# Patient Record
Sex: Female | Born: 2000 | Race: White | Hispanic: No | Smoking: Former smoker
Health system: Southern US, Community
[De-identification: ages and names within clinical notes are randomized; demographics above are authoritative.]

## PROBLEM LIST (undated history)

## (undated) ENCOUNTER — Ambulatory Visit (HOSPITAL_COMMUNITY): Admission: EM | Payer: 59 | Source: Home / Self Care

## (undated) DIAGNOSIS — L709 Acne, unspecified: Secondary | ICD-10-CM

## (undated) DIAGNOSIS — I10 Essential (primary) hypertension: Secondary | ICD-10-CM

## (undated) HISTORY — DX: Essential (primary) hypertension: I10

## (undated) HISTORY — DX: Acne, unspecified: L70.9

## (undated) HISTORY — PX: WISDOM TOOTH EXTRACTION: SHX21

---
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
## 2020-02-23 ENCOUNTER — Encounter: Payer: 59 | Admitting: Family Medicine

## 2020-05-06 ENCOUNTER — Encounter: Payer: 59 | Admitting: Family Medicine

## 2020-05-09 ENCOUNTER — Encounter: Payer: 59 | Admitting: Family Medicine

## 2020-05-30 ENCOUNTER — Other Ambulatory Visit: Payer: Self-pay

## 2020-05-30 ENCOUNTER — Encounter: Payer: Self-pay | Admitting: Family Medicine

## 2020-05-30 ENCOUNTER — Ambulatory Visit (INDEPENDENT_AMBULATORY_CARE_PROVIDER_SITE_OTHER): Payer: 59 | Admitting: Family Medicine

## 2020-05-30 VITALS — BP 128/76 | HR 88 | Temp 98.0°F | Resp 14 | Ht 63.5 in | Wt 194.0 lb

## 2020-05-30 DIAGNOSIS — Z309 Encounter for contraceptive management, unspecified: Secondary | ICD-10-CM

## 2020-05-30 DIAGNOSIS — Z0000 Encounter for general adult medical examination without abnormal findings: Secondary | ICD-10-CM

## 2020-05-30 DIAGNOSIS — E669 Obesity, unspecified: Secondary | ICD-10-CM

## 2020-05-30 DIAGNOSIS — Z23 Encounter for immunization: Secondary | ICD-10-CM

## 2020-05-30 DIAGNOSIS — Z0001 Encounter for general adult medical examination with abnormal findings: Secondary | ICD-10-CM

## 2020-05-30 NOTE — Addendum Note (Signed)
Addended by: Sheral Flow on: 05/30/2020 06:05 PM   Modules accepted: Orders

## 2020-05-30 NOTE — Progress Notes (Signed)
Subjective:    Patient ID: Faith Bowers, female    DOB: May 15, 2000, 20 y.o.   MRN: [id number]  HPI  Patient is a very pleasant 20 year old Caucasian female here today for complete physical exam.  She has not yet 21 so she does not require Pap smear.  However her Nexplanon is about to expire and she would like to see a gynecologist for her pelvic exam as well as future Pap smears.  Otherwise she is doing well.  She does have a history of obesity, mild hyperglycemia, family history of diabetes, and she also smokes. Past Medical History:  Diagnosis Date  . Acne    Past Surgical History:  Procedure Laterality Date  . TONSILLECTOMY AND ADENOIDECTOMY  2007  . WISDOM TOOTH EXTRACTION     Current Outpatient Medications on File Prior to Visit  Medication Sig Dispense Refill  . Etonogestrel (NEXPLANON [place]) Inject into the skin.    Marland Kitchen guaiFENesin (MUCINEX) 600 MG 12 hr tablet Take by mouth 2 (two) times daily.     No current facility-administered medications on file prior to visit.   No Known Allergies Social History   Socioeconomic History  . Marital status: Single    Spouse name: Not on file  . Number of children: Not on file  . Years of education: Not on file  . Highest education level: Not on file  Occupational History  . Not on file  Tobacco Use  . Smoking status: Current Every Day Smoker    Types: Cigarettes  . Smokeless tobacco: Never Used  Vaping Use  . Vaping Use: Former  Substance and Sexual Activity  . Alcohol use: Never    Alcohol/week: 0.0 standard drinks  . Drug use: Never  . Sexual activity: Yes    Birth control/protection: Implant  Other Topics Concern  . Not on file  Social History Narrative  . Not on file   Social Determinants of Health   Financial Resource Strain: Not on file  Food Insecurity: Not on file  Transportation Needs: Not on file  Physical Activity: Not on file  Stress: Not on file  Social Connections: Not on file  Intimate Partner  Violence: Not on file   Family History  Problem Relation Age of Onset  . Anxiety disorder Mother   . Asthma Mother   . Diabetes Mother   . Hypertension Mother   . Hyperlipidemia Mother   . Asthma Brother   . Diabetes Brother   . Hypertension Brother   . Arthritis Maternal Grandmother   . Diabetes Maternal Grandmother   . Arthritis Maternal Grandfather   . COPD Maternal Grandfather   . Diabetes Maternal Grandfather      Review of Systems  All other systems reviewed and are negative.      Objective:   Physical Exam Vitals reviewed.  Constitutional:      General: She is not in acute distress.    Appearance: Normal appearance. She is obese. She is not ill-appearing, toxic-appearing or diaphoretic.  HENT:     Head: Normocephalic and atraumatic.     Right Ear: Tympanic membrane and ear canal normal. There is no impacted cerumen.     Left Ear: Tympanic membrane and ear canal normal. There is no impacted cerumen.     Nose: Nose normal. No congestion or rhinorrhea.     Mouth/Throat:     Mouth: Mucous membranes are moist.     Pharynx: No oropharyngeal exudate or posterior oropharyngeal erythema.  Eyes:  General:        Right eye: No discharge.        Left eye: No discharge.     Extraocular Movements: Extraocular movements intact.     Conjunctiva/sclera: Conjunctivae normal.     Pupils: Pupils are equal, round, and reactive to light.  Neck:     Vascular: No carotid bruit.  Cardiovascular:     Rate and Rhythm: Normal rate and regular rhythm.     Pulses: Normal pulses.     Heart sounds: Normal heart sounds. No murmur heard. No friction rub. No gallop.   Pulmonary:     Effort: Pulmonary effort is normal. No respiratory distress.     Breath sounds: Normal breath sounds. No stridor. No wheezing, rhonchi or rales.  Chest:     Chest wall: No tenderness.  Abdominal:     General: Abdomen is flat. Bowel sounds are normal. There is no distension.     Palpations: Abdomen is  soft. There is no mass.     Tenderness: There is no abdominal tenderness. There is no guarding or rebound.     Hernia: No hernia is present.  Musculoskeletal:     Cervical back: Normal range of motion and neck supple. No rigidity or tenderness.     Right lower leg: No edema.     Left lower leg: No edema.  Lymphadenopathy:     Cervical: No cervical adenopathy.  Skin:    General: Skin is warm.     Coloration: Skin is not jaundiced or pale.     Findings: No bruising, erythema, lesion or rash.  Neurological:     General: No focal deficit present.     Mental Status: She is alert and oriented to person, place, and time. Mental status is at baseline.     Cranial Nerves: No cranial nerve deficit.     Sensory: No sensory deficit.     Motor: No weakness.     Coordination: Coordination normal.     Gait: Gait normal.     Deep Tendon Reflexes: Reflexes normal.  Psychiatric:        Mood and Affect: Mood normal.        Behavior: Behavior normal.        Thought Content: Thought content normal.        Judgment: Judgment normal.           Assessment & Plan:  Obesity (BMI 30-39.9)  General medical exam  Encounter for contraceptive management, unspecified type  Patient would like to establish care with a gynecologist.  She is not yet due for a Pap smear however she prefers to see a female provider for a pelvic exam because she is having some pelvic cramping.  She is currently using a Nexplanon for birth control and this is about to expire.  Therefore she would like to establish care with a gynecologist to discuss possibly an IUD.  I will be glad to arrange this.  She received her meningitis vaccine today.  I would like her to return fasting for a CBC, CMP, lipid panel, and an A1c.

## 2020-06-20 ENCOUNTER — Encounter: Payer: Self-pay | Admitting: Obstetrics & Gynecology

## 2020-07-12 ENCOUNTER — Encounter: Payer: Self-pay | Admitting: Obstetrics & Gynecology

## 2020-08-13 ENCOUNTER — Other Ambulatory Visit: Payer: Self-pay

## 2020-08-13 ENCOUNTER — Ambulatory Visit (INDEPENDENT_AMBULATORY_CARE_PROVIDER_SITE_OTHER): Payer: 59 | Admitting: Women's Health

## 2020-08-13 ENCOUNTER — Other Ambulatory Visit: Payer: Self-pay | Admitting: Women's Health

## 2020-08-13 ENCOUNTER — Encounter: Payer: Self-pay | Admitting: Women's Health

## 2020-08-13 VITALS — BP 148/83 | HR 106 | Ht 63.0 in | Wt 192.6 lb

## 2020-08-13 DIAGNOSIS — Z113 Encounter for screening for infections with a predominantly sexual mode of transmission: Secondary | ICD-10-CM | POA: Diagnosis not present

## 2020-08-13 DIAGNOSIS — R1010 Upper abdominal pain, unspecified: Secondary | ICD-10-CM

## 2020-08-13 DIAGNOSIS — Z3009 Encounter for other general counseling and advice on contraception: Secondary | ICD-10-CM

## 2020-08-13 NOTE — Progress Notes (Signed)
   GYN VISIT Patient name: Faith Bowers MRN [id number]  Date of birth: [birth year] Chief Complaint:   Contraception  History of Present Illness:   Faith Bowers is a 20 y.o. G0P0000 Caucasian female being seen today as new pt to discuss birth control. Currently has Nexplanon inserted Jan 2020 in [place]. Has been having upper abdominal pain and wonders if it's from nexplanon. Normal bm's per pt. Denies lower abd/pelvic pain. Periods irregular since Nexplanon insertion-  were regular prior to insertion-discussed this is normal. Denies abnormal discharge, itching/odor/irritation.  Discussed all options of contraception- thinks she wants to stay w/ Nexplanon and get reinserted in Jan when due.  Patient's last menstrual period was 05/23/2020 (approximate). The current method of family planning is Nexplanon.  Last pap <21yo.   Depression screen [facility] 2/9 08/13/2020 05/30/2020 10/04/2017 08/03/2017  Decreased Interest 0 0 0 0  Down, Depressed, Hopeless 0 0 0 0  PHQ - 2 Score 0 0 0 0  Altered sleeping 0 0 0 0  Tired, decreased energy 0 0 2 2  Change in appetite 0 0 0 0  Feeling bad or failure about yourself  0 0 0 0  Trouble concentrating 0 0 0 0  Moving slowly or fidgety/restless 0 0 0 0  Suicidal thoughts 0 0 0 0  PHQ-9 Score 0 0 2 2  Difficult doing work/chores - Not difficult at all Not difficult at all -     GAD 7 : Generalized Anxiety Score 08/13/2020  Nervous, Anxious, on Edge 0  Control/stop worrying 0  Worry too much - different things 0  Trouble relaxing 0  Restless 0  Easily annoyed or irritable 0  Afraid - awful might happen 0  Total GAD 7 Score 0     Review of Systems:   Pertinent items are noted in HPI Denies fever/chills, dizziness, headaches, visual disturbances, fatigue, shortness of breath, chest pain, abdominal pain, vomiting, abnormal vaginal discharge/itching/odor/irritation, problems with periods, bowel movements, urination, or intercourse unless otherwise stated  above.  Pertinent History Reviewed:  Reviewed past medical,surgical, social, obstetrical and family history.  Reviewed problem list, medications and allergies. Physical Assessment:   Vitals:   08/13/20 1058  BP: (!) 148/83  Pulse: (!) 106  Weight: 192 lb 9.6 oz (87.4 kg)  Height: 5\' 3"  (1.6 m)  Body mass index is 34.12 kg/m.       Physical Examination:   General appearance: alert, well appearing, and in no distress  Mental status: alert, oriented to person, place, and time  Skin: warm & dry   Cardiovascular: normal heart rate noted  Respiratory: normal respiratory effort, no distress  Abdomen: soft, non-tender   Pelvic: examination not indicated  Extremities: no edema   Chaperone: N/A    No results found for this or any previous visit (from the past 24 hour(s)).  Assessment & Plan:  1) Contraception counseling> wants to stay w/ Nexplanon, schedule removal/reinsertion for Jan 2023  2) Upper abdominal pain> make appt w/ PCP, if no reason found for pain and wants to remove nexplanon to see if it has anything to do with it- call to make appt  3) Elevated bp> f/u w/ PCP  4) STD screen> gc/ct  Meds: No orders of the defined types were placed in this encounter.   Orders Placed This Encounter  Procedures   GC/Chlamydia Probe Amp    Return for Jan for nexplanon removal/reinsertion.  [place], [facility] 08/13/2020 11:25 AM

## 2020-08-15 LAB — GC/CHLAMYDIA PROBE AMP
Chlamydia trachomatis, NAA: NEGATIVE
Neisseria Gonorrhoeae by PCR: NEGATIVE

## 2021-02-25 ENCOUNTER — Encounter: Payer: Self-pay | Admitting: Women's Health

## 2021-02-25 ENCOUNTER — Other Ambulatory Visit: Payer: Self-pay

## 2021-02-25 ENCOUNTER — Ambulatory Visit: Payer: 59 | Admitting: Women's Health

## 2021-02-25 VITALS — BP 138/90 | HR 92 | Ht 64.0 in | Wt 192.6 lb

## 2021-02-25 DIAGNOSIS — Z30017 Encounter for initial prescription of implantable subdermal contraceptive: Secondary | ICD-10-CM | POA: Insufficient documentation

## 2021-02-25 DIAGNOSIS — Z3046 Encounter for surveillance of implantable subdermal contraceptive: Secondary | ICD-10-CM | POA: Diagnosis not present

## 2021-02-25 DIAGNOSIS — Z3202 Encounter for pregnancy test, result negative: Secondary | ICD-10-CM

## 2021-02-25 LAB — POCT URINE PREGNANCY: Preg Test, Ur: NEGATIVE

## 2021-02-25 MED ORDER — ETONOGESTREL 68 MG ~~LOC~~ IMPL
68.0000 mg | DRUG_IMPLANT | Freq: Once | SUBCUTANEOUS | Status: AC
  Administered 2021-02-25: 68 mg via SUBCUTANEOUS

## 2021-02-25 NOTE — Patient Instructions (Signed)
Keep the area clean and dry.  You can remove the big bandage in 24 hours, and the small steri-strip bandage in 3-5 days.  A back up method, such as condoms, should be used for two weeks. You may have irregular vaginal bleeding for the first 6 months after the Nexplanon is placed, then the bleeding usually lightens and it is possible that you may not have any periods.  If you have any concerns, please give [place] a call.    Etonogestrel Implant What is this medication? ETONOGESTREL (et oh noe JES trel) prevents ovulation and pregnancy. It belongs to a group of medications called contraceptives. This medication is a progestin hormone. This medicine may be used for other purposes; ask your health care provider or pharmacist if you have questions. COMMON BRAND NAME(S): Implanon, Nexplanon What should I tell my care team before I take this medication? They need to know if you have any of these conditions: Abnormal vaginal bleeding Blood vessel disease or blood clots Breast, cervical, endometrial, ovarian, liver, or uterine cancer Diabetes Gallbladder disease Heart disease or recent heart attack High blood pressure High cholesterol or triglycerides Kidney disease Liver disease Migraine headaches Seizures Stroke Tobacco smoker An unusual or allergic reaction to etonogestrel, anesthetics or antiseptics, other medications, foods, dyes, or preservatives Pregnant or trying to get pregnant Breast-feeding How should I use this medication? This device is inserted just under the skin on the inner side of your upper arm by your care team. Talk to your care team about the use of this medication in children. Special care may be needed. Overdosage: If you think you have taken too much of this medicine contact a poison control center or emergency room at once. NOTE: This medicine is only for you. Do not share this medicine with others. What if I miss a dose? This does not apply. What may interact with this  medication? Do not take this medication with any of the following: Amprenavir Fosamprenavir This medication may also interact with the following: Acitretin Aprepitant Armodafinil Bexarotene Bosentan Carbamazepine Certain medications for fungal infections like fluconazole, ketoconazole, itraconazole and voriconazole Certain medications to treat hepatitis, HIV or AIDS Cyclosporine Felbamate Griseofulvin Lamotrigine Modafinil Oxcarbazepine Phenobarbital Phenytoin Primidone Rifabutin Rifampin Rifapentine St. John's wort Topiramate This list may not describe all possible interactions. Give your health care provider a list of all the medicines, herbs, non-prescription drugs, or dietary supplements you use. Also tell them if you smoke, drink alcohol, or use illegal drugs. Some items may interact with your medicine. What should I watch for while using this medication? This product does not protect you against HIV infection (AIDS) or other sexually transmitted diseases. You should be able to feel the implant by pressing your fingertips over the skin where it was inserted. Contact your care team if you cannot feel the implant, and use a non-hormonal birth control method (such as condoms) until your care team confirms that the implant is in place. Contact your care team if you think that the implant may have broken or become bent while in your arm. You will receive a user card from your care team after the implant is inserted. The card is a record of the location of the implant in your upper arm and when it should be removed. Keep this card with your health records. What side effects may I notice from receiving this medication? Side effects that you should report to your care team as soon as possible: Allergic reactions--skin rash, itching, hives, swelling of  the face, lips, tongue, or throat Blood clot--pain, swelling, or warmth in the leg, shortness of breath, chest pain Gallbladder  problems--severe stomach pain, nausea, vomiting, fever Increase in blood pressure Liver injury--right upper belly pain, loss of appetite, nausea, light-colored stool, dark yellow or brown urine, yellowing skin or eyes, unusual weakness or fatigue New or worsening migraines or headaches Pain, redness, or irritation at injection site Stroke--sudden numbness or weakness of the face, arm, or leg, trouble speaking, confusion, trouble walking, loss of balance or coordination, dizziness, severe headache, change in vision Unusual vaginal discharge, itching, or odor Worsening mood, feelings of depression Side effects that usually do not require medical attention (report to your care team if they continue or are bothersome): Breast pain or tenderness Dark patches of skin on the face or other sun-exposed areas Irregular menstrual cycles or spotting Nausea Weight gain This list may not describe all possible side effects. Call your doctor for medical advice about side effects. You may report side effects to FDA at 1-800-FDA-1088. Where should I keep my medication? This medication is given in a hospital or clinic and will not be stored at home. NOTE: This sheet is a summary. It may not cover all possible information. If you have questions about this medicine, talk to your doctor, pharmacist, or health care provider.  2022 Elsevier/Gold Standard (2020-10-22 00:00:00)

## 2021-02-25 NOTE — Progress Notes (Signed)
[facility] REMOVAL AND RE-INSERTION Patient name: Faith Bowers MRN [id number]  Date of birth: 2000-04-19 Subjective Findings:   Faith Bowers is a 21 y.o. G0P0000 Caucasian female being seen today for Nexplanon removal and re-insertion. Her Nexplanon was placed 2020 in [place].   Patient's last menstrual period was 01/21/2021 (approximate). Last pap<21yo. Results were: N/A  Risks/benefits/side effects of Nexplanon have been discussed and her questions have been answered.  Specifically, a failure rate of 02/998 has been reported, with an increased failure rate if pt takes [place] and/or antiseizure medicaitons.  She is aware of the common side effect of irregular bleeding, which the incidence of decreases over time. Signed copy of informed consent in chart.   Depression screen [facility] 2/9 08/13/2020 05/30/2020 10/04/2017 08/03/2017  Decreased Interest 0 0 0 0  Down, Depressed, Hopeless 0 0 0 0  PHQ - 2 Score 0 0 0 0  Altered sleeping 0 0 0 0  Tired, decreased energy 0 0 2 2  Change in appetite 0 0 0 0  Feeling bad or failure about yourself  0 0 0 0  Trouble concentrating 0 0 0 0  Moving slowly or fidgety/restless 0 0 0 0  Suicidal thoughts 0 0 0 0  PHQ-9 Score 0 0 2 2  Difficult doing work/chores - Not difficult at all Not difficult at all -     GAD 7 : Generalized Anxiety Score 08/13/2020  Nervous, Anxious, on Edge 0  Control/stop worrying 0  Worry too much - different things 0  Trouble relaxing 0  Restless 0  Easily annoyed or irritable 0  Afraid - awful might happen 0  Total GAD 7 Score 0     Pertinent History Reviewed:   Reviewed past medical,surgical, social, obstetrical and family history.  Reviewed problem list, medications and allergies. Objective Findings & Procedure:    Vitals:   02/25/21 1045  BP: 138/90  Pulse: 92  Weight: 192 lb 9.6 oz (87.4 kg)  Height: 5\' 4"  (1.626 m)  Body mass index is 33.06 kg/m.  Results for orders placed or performed in  visit on 02/25/21 (from the past 24 hour(s))  POCT urine pregnancy   Collection Time: 02/25/21 10:51 AM  Result Value Ref Range   Preg Test, Ur Negative Negative     Time out was performed.  Nexplanon site identified.  Area prepped in usual sterile fashon. Two cc's of 2% lidocaine was used to anesthetize the area. A small stab incision was made right beside the implant on the distal portion.  The Nexplanon rod was grasped using hemostats and removed intact without difficulty.  The area was cleansed again with betadine and the Nexplanon was inserted approximately 10cm from the medial epicondyle and 3-5cm posterior to the sulcus per manufacturer's recommendations without difficulty.  Steri-strips and a pressure bandage was applied.  There was less than 3 cc blood loss. There were no complications.  The patient tolerated the procedure well. Assessment & Plan:   1) Nexplanon removal & re-insertion She was instructed to keep the area clean and dry, remove pressure bandage in 24 hours, and keep insertion site covered with the steri-strips for 3-5 days.  She was given a card indicating date Nexplanon was inserted and date it needs to be removed.  Follow-up PRN problems.  Orders Placed This Encounter  Procedures   POCT urine pregnancy    Follow-up: Return for after 10/18 for , Pap & physical.  Roma Schanz CNM, WHNP-BC  02/25/2021 11:10 AM

## 2021-04-17 ENCOUNTER — Ambulatory Visit: Payer: 59 | Admitting: Family Medicine

## 2021-04-17 ENCOUNTER — Other Ambulatory Visit: Payer: Self-pay

## 2021-04-17 ENCOUNTER — Encounter: Payer: Self-pay | Admitting: Family Medicine

## 2021-04-17 VITALS — BP 148/88 | HR 98 | Temp 98.1°F | Resp 18 | Ht 64.0 in | Wt 192.0 lb

## 2021-04-17 DIAGNOSIS — R739 Hyperglycemia, unspecified: Secondary | ICD-10-CM | POA: Diagnosis not present

## 2021-04-17 DIAGNOSIS — I10 Essential (primary) hypertension: Secondary | ICD-10-CM

## 2021-04-17 MED ORDER — AMLODIPINE BESYLATE 5 MG PO TABS: 5.0000 mg | ORAL_TABLET | Freq: Every day | ORAL | 3 refills | Status: DC

## 2021-04-17 NOTE — Progress Notes (Signed)
? ?Subjective:  ? ? Patient ID: Faith Bowers, female    DOB: 11-Oct-2000, 21 y.o.   MRN: [id number] ? ?HPI ?Patient is a very pleasant 21 year old Caucasian female who presents today with elevated blood pressure.  She has been monitoring her blood pressure at home for quite some time.  She has been getting majority of her readings above 140/90.  Recently she stepped on a nail in the urgent care her blood pressure was over 371 systolic.  Her blood pressure is again elevated today.  She has more than 10 readings over the last 2 weeks where her blood pressure has been greater than 140/90.  Her mom has a history of hypertension.  Family history also has diabetes and hyperlipidemia.  Patient is still smoking.  She denies any symptoms of sleep apnea. ?Past Medical History:  ?Diagnosis Date  ? Acne   ? ?Past Surgical History:  ?Procedure Laterality Date  ? TONSILLECTOMY AND ADENOIDECTOMY  2007  ? WISDOM TOOTH EXTRACTION    ? ?Current Outpatient Medications on File Prior to Visit  ?Medication Sig Dispense Refill  ? cephALEXin (KEFLEX) 500 MG capsule Take 1,000 mg by mouth 2 (two) times daily.    ? Etonogestrel (NEXPLANON [place]) Inject into the skin.    ? guaiFENesin (MUCINEX) 600 MG 12 hr tablet Take by mouth 2 (two) times daily as needed.    ? ibuprofen (ADVIL) 800 MG tablet Take 800 mg by mouth every 8 (eight) hours as needed.    ? ?No current facility-administered medications on file prior to visit.  ? ?No Known Allergies ?Social History  ? ?Socioeconomic History  ? Marital status: Single  ?  Spouse name: Not on file  ? Number of children: Not on file  ? Years of education: Not on file  ? Highest education level: Not on file  ?Occupational History  ? Not on file  ?Tobacco Use  ? Smoking status: Every Day  ?  Packs/day: 0.50  ?  Years: 3.00  ?  Pack years: 1.50  ?  Types: Cigarettes  ? Smokeless tobacco: Never  ?Vaping Use  ? Vaping Use: Former  ?Substance and Sexual Activity  ? Alcohol use: Yes  ?  Comment: occ  ? Drug  use: Never  ? Sexual activity: Yes  ?  Birth control/protection: Implant  ?Other Topics Concern  ? Not on file  ?Social History Narrative  ? Not on file  ? ?Social Determinants of Health  ? ?Financial Resource Strain: Unknown  ? Difficulty of Paying Living Expenses: Patient refused  ?Food Insecurity: No Food Insecurity  ? Worried About Charity fundraiser in the Last Year: Never true  ? Ran Out of Food in the Last Year: Never true  ?Transportation Needs: No Transportation Needs  ? Lack of Transportation (Medical): No  ? Lack of Transportation (Non-Medical): No  ?Physical Activity: Insufficiently Active  ? Days of Exercise per Week: 2 days  ? Minutes of Exercise per Session: 20 min  ?Stress: No Stress Concern Present  ? Feeling of Stress : Only a little  ?Social Connections: Unknown  ? Frequency of Communication with Friends and Family: Three times a week  ? Frequency of Social Gatherings with Friends and Family: Once a week  ? Attends Religious Services: 1 to 4 times per year  ? Active Member of Clubs or Organizations: No  ? Attends Archivist Meetings: Never  ? Marital Status: Patient refused  ?Intimate Partner Violence: Not At Risk  ?  Fear of Current or Ex-Partner: No  ? Emotionally Abused: No  ? Physically Abused: No  ? Sexually Abused: No  ? ?Family History  ?Problem Relation Age of Onset  ? Anxiety disorder Mother   ? Asthma Mother   ? Diabetes Mother   ? Hypertension Mother   ? Hyperlipidemia Mother   ? Asthma Brother   ? Diabetes Brother   ? Hypertension Brother   ? Arthritis Maternal Grandmother   ? Diabetes Maternal Grandmother   ? Arthritis Maternal Grandfather   ? COPD Maternal Grandfather   ? Diabetes Maternal Grandfather   ? ? ? ?Review of Systems  ?All other systems reviewed and are negative. ? ?   ?Objective:  ? Physical Exam ?Vitals reviewed.  ?Constitutional:   ?   General: She is not in acute distress. ?   Appearance: Normal appearance. She is obese. She is not ill-appearing,  toxic-appearing or diaphoretic.  ?HENT:  ?   Head: Normocephalic and atraumatic.  ?   Right Ear: Tympanic membrane and ear canal normal. There is no impacted cerumen.  ?   Left Ear: Tympanic membrane and ear canal normal. There is no impacted cerumen.  ?   Nose: Nose normal. No congestion or rhinorrhea.  ?   Mouth/Throat:  ?   Mouth: Mucous membranes are moist.  ?   Pharynx: No oropharyngeal exudate or posterior oropharyngeal erythema.  ?Eyes:  ?   General:     ?   Right eye: No discharge.     ?   Left eye: No discharge.  ?   Extraocular Movements: Extraocular movements intact.  ?   Conjunctiva/sclera: Conjunctivae normal.  ?   Pupils: Pupils are equal, round, and reactive to light.  ?Neck:  ?   Vascular: No carotid bruit.  ?Cardiovascular:  ?   Rate and Rhythm: Normal rate and regular rhythm.  ?   Pulses: Normal pulses.  ?   Heart sounds: Normal heart sounds. No murmur heard. ?  No friction rub. No gallop.  ?Pulmonary:  ?   Effort: Pulmonary effort is normal. No respiratory distress.  ?   Breath sounds: Normal breath sounds. No stridor. No wheezing, rhonchi or rales.  ?Chest:  ?   Chest wall: No tenderness.  ?Abdominal:  ?   General: Abdomen is flat. Bowel sounds are normal. There is no distension.  ?   Palpations: Abdomen is soft. There is no mass.  ?   Tenderness: There is no abdominal tenderness. There is no guarding or rebound.  ?   Hernia: No hernia is present.  ?Musculoskeletal:  ?   Cervical back: Normal range of motion and neck supple. No rigidity or tenderness.  ?   Right lower leg: No edema.  ?   Left lower leg: No edema.  ?Lymphadenopathy:  ?   Cervical: No cervical adenopathy.  ?Skin: ?   General: Skin is warm.  ?   Coloration: Skin is not jaundiced or pale.  ?   Findings: No bruising, erythema, lesion or rash.  ?Neurological:  ?   General: No focal deficit present.  ?   Mental Status: She is alert and oriented to person, place, and time. Mental status is at baseline.  ?   Cranial Nerves: No cranial  nerve deficit.  ?   Sensory: No sensory deficit.  ?   Motor: No weakness.  ?   Coordination: Coordination normal.  ?   Gait: Gait normal.  ?   Deep Tendon Reflexes:  Reflexes normal.  ?Psychiatric:     ?   Mood and Affect: Mood normal.     ?   Behavior: Behavior normal.     ?   Thought Content: Thought content normal.     ?   Judgment: Judgment normal.  ? ? ? ? ? ?   ?Assessment & Plan:  ?Elevated blood sugar - Plan: Hemoglobin A1c ? ?Benign essential HTN - Plan: CBC with Differential/Platelet, Lipid panel, COMPLETE METABOLIC PANEL WITH GFR ?We discussed options including an ACE inhibitor, calcium channel blocker, and hydrochlorothiazide.  Due to the fact she is in childbearing years, I would not recommend an ACE inhibitor due to potential birth defects in babies.  Ultimately she decided to try amlodipine 5 mg a day and she will recheck her blood pressures and let me know if they are running in 3 weeks.  I would like her to come in fasting so that we can check a CBC, CMP, lipid panel, and an A1c given her history of elevated blood sugars in the past. ?

## 2021-04-24 ENCOUNTER — Other Ambulatory Visit: Payer: Self-pay

## 2021-04-24 ENCOUNTER — Other Ambulatory Visit: Payer: 59

## 2021-04-25 LAB — COMPLETE METABOLIC PANEL WITH GFR
AG Ratio: 1.9 (calc) (ref 1.0–2.5)
ALT: 15 U/L (ref 6–29)
AST: 13 U/L (ref 10–30)
Albumin: 4.7 g/dL (ref 3.6–5.1)
Alkaline phosphatase (APISO): 51 U/L (ref 31–125)
BUN: 21 mg/dL (ref 7–25)
CO2: 27 mmol/L (ref 20–32)
Calcium: 10 mg/dL (ref 8.6–10.2)
Chloride: 105 mmol/L (ref 98–110)
Creat: 0.81 mg/dL (ref 0.50–0.96)
Globulin: 2.5 g/dL (calc) (ref 1.9–3.7)
Glucose, Bld: 113 mg/dL — ABNORMAL HIGH (ref 65–99)
Potassium: 4.9 mmol/L (ref 3.5–5.3)
Sodium: 140 mmol/L (ref 135–146)
Total Bilirubin: 0.4 mg/dL (ref 0.2–1.2)
Total Protein: 7.2 g/dL (ref 6.1–8.1)
eGFR: 107 mL/min/{1.73_m2} (ref >=60)

## 2021-04-25 LAB — HEMOGLOBIN A1C
Hgb A1c MFr Bld: 5.4 % of total Hgb (ref <5.7)
Mean Plasma Glucose: 108 mg/dL
eAG (mmol/L): 6 mmol/L

## 2021-04-25 LAB — LIPID PANEL
Cholesterol: 129 mg/dL (ref <200)
HDL: 48 mg/dL — ABNORMAL LOW (ref >=50)
LDL Cholesterol (Calc): 68 mg/dL (calc)
Non-HDL Cholesterol (Calc): 81 mg/dL (calc) (ref <130)
Total CHOL/HDL Ratio: 2.7 (calc) (ref <5.0)
Triglycerides: 57 mg/dL (ref <150)

## 2021-04-25 LAB — CBC WITH DIFFERENTIAL/PLATELET
Absolute Monocytes: 641 cells/uL (ref 200–950)
Basophils Absolute: 32 cells/uL (ref 0–200)
Basophils Relative: 0.3 %
Eosinophils Absolute: 126 cells/uL (ref 15–500)
Eosinophils Relative: 1.2 %
HCT: 47.2 % — ABNORMAL HIGH (ref 35.0–45.0)
Hemoglobin: 15.9 g/dL — ABNORMAL HIGH (ref 11.7–15.5)
Lymphs Abs: 3791 cells/uL (ref 850–3900)
MCH: 31.2 pg (ref 27.0–33.0)
MCHC: 33.7 g/dL (ref 32.0–36.0)
MCV: 92.5 fL (ref 80.0–100.0)
MPV: 10.9 fL (ref 7.5–12.5)
Monocytes Relative: 6.1 %
Neutro Abs: 5912 cells/uL (ref 1500–7800)
Neutrophils Relative %: 56.3 %
Platelets: 273 10*3/uL (ref 140–400)
RBC: 5.1 10*6/uL (ref 3.80–5.10)
RDW: 11.6 % (ref 11.0–15.0)
Total Lymphocyte: 36.1 %
WBC: 10.5 10*3/uL (ref 3.8–10.8)

## 2021-12-26 ENCOUNTER — Other Ambulatory Visit (HOSPITAL_COMMUNITY)
Admission: RE | Admit: 2021-12-26 | Discharge: 2021-12-26 | Disposition: A | Discharge status: Home / Self Care | Payer: 59 | Source: Ambulatory Visit | Attending: Adult Health | Admitting: Adult Health

## 2021-12-26 ENCOUNTER — Ambulatory Visit (INDEPENDENT_AMBULATORY_CARE_PROVIDER_SITE_OTHER): Payer: 59 | Admitting: Adult Health

## 2021-12-26 ENCOUNTER — Encounter: Payer: Self-pay | Admitting: Adult Health

## 2021-12-26 VITALS — BP 137/99 | HR 114 | Ht 63.0 in | Wt 226.0 lb

## 2021-12-26 DIAGNOSIS — I10 Essential (primary) hypertension: Secondary | ICD-10-CM | POA: Diagnosis not present

## 2021-12-26 DIAGNOSIS — Z01419 Encounter for gynecological examination (general) (routine) without abnormal findings: Secondary | ICD-10-CM

## 2021-12-26 DIAGNOSIS — Z975 Presence of (intrauterine) contraceptive device: Secondary | ICD-10-CM | POA: Diagnosis not present

## 2021-12-26 NOTE — Progress Notes (Signed)
Patient ID: Faith Bowers, female   DOB: 03-30-2000, 21 y.o.   MRN: [id number] History of Present Illness: Faith Bowers is a 21 year old white female,single, G0P0, in for well woman gyn exam and first pap. She is nervous and did not tke BP meds this morning.  PCP is Dr Dennard Schaumann.   Current Medications, Allergies, Past Medical History, Past Surgical History, Family History and Social History were reviewed in [employer] record.     Review of Systems: Patient denies any headaches, hearing loss, fatigue, blurred vision, shortness of breath, chest pain, abdominal pain, problems with bowel movements, urination, or intercourse. No joint pain or mood swings.     Physical Exam:BP (!) 137/99 (BP Location: Left Arm, Patient Position: Sitting, Cuff Size: Normal)   Pulse (!) 114   Ht '5\' 3"'$  (1.6 m)   Wt 226 lb (102.5 kg)   BMI 40.03 kg/m   General:  Well developed, well nourished, no acute distress Skin:  Warm and dry Neck:  Midline trachea, normal thyroid, good ROM, no lymphadenopathy Lungs; Clear to auscultation bilaterally Breast:  No dominant palpable mass, retraction, or nipple discharge Cardiovascular: Regular rate and rhythm Abdomen:  Soft, non tender, no hepatosplenomegaly Pelvic:  External genitalia is normal in appearance, no lesions.  The vagina is normal in appearance. Urethra has no lesions or masses. The cervix is smooth, pap with GC/CHL performed.Marland Kitchen  Uterus is felt to be normal size, shape, and contour.  No adnexal masses or tenderness noted.Bladder is non tender, no masses felt. Rectal: Deferred  Extremities/musculoskeletal:  No swelling or varicosities noted, no clubbing or cyanosis Psych:  No mood changes, alert and cooperative,seems happy AA is 1 Fall risk is low    12/26/2021    8:37 AM 08/13/2020   10:52 AM 05/30/2020    3:02 PM  Depression screen PHQ 2/9  Decreased Interest 0 0 0  Down, Depressed, Hopeless 0 0 0  PHQ - 2 Score 0 0 0  Altered  sleeping 1 0 0  Tired, decreased energy 0 0 0  Change in appetite 0 0 0  Feeling bad or failure about yourself  1 0 0  Trouble concentrating 0 0 0  Moving slowly or fidgety/restless 0 0 0  Suicidal thoughts 0 0 0  PHQ-9 Score 2 0 0  Difficult doing work/chores   Not difficult at all       12/26/2021    8:37 AM 08/13/2020   10:52 AM  GAD 7 : Generalized Anxiety Score  Nervous, Anxious, on Edge 1 0  Control/stop worrying 1 0  Worry too much - different things 1 0  Trouble relaxing 0 0  Restless 0 0  Easily annoyed or irritable 1 0  Afraid - awful might happen 1 0  Total GAD 7 Score 5 0      Upstream - 12/26/21 0845       Pregnancy Intention Screening   Does the patient want to become pregnant in the next year? No    Does the patient's partner want to become pregnant in the next year? No    Would the patient like to discuss contraceptive options today? No      Contraception Wrap Up   Current Method Hormonal Implant    End Method Hormonal Implant    Contraception Counseling Provided No            Examination chaperoned by Tish RN  Impression and Plan: 1. Encounter for gynecological examination with Papanicolaou  smear of cervix Pap sent Pap in 3 years if normal Physical in 1 year  Had labs with PCP  2. Hypertension, unspecified type Take BP meds when its home, is on Norvasc 5 mg 1 daily from PCP Decrease salts and sugars and be activity   3. Nexplanon in place Placed 02/25/21

## 2021-12-29 LAB — CYTOLOGY - PAP
Chlamydia: NEGATIVE
Comment: NEGATIVE
Comment: NEGATIVE
Comment: NORMAL
Diagnosis: NEGATIVE
Neisseria Gonorrhea: NEGATIVE
Trichomonas: NEGATIVE

## 2022-02-10 ENCOUNTER — Telehealth: Payer: Self-pay

## 2022-02-10 ENCOUNTER — Other Ambulatory Visit: Payer: Self-pay | Admitting: Family Medicine

## 2022-02-10 MED ORDER — ONDANSETRON HCL 4 MG PO TABS: 4.0000 mg | ORAL_TABLET | Freq: Three times a day (TID) | ORAL | 0 refills | Status: DC | PRN

## 2022-02-10 NOTE — Telephone Encounter (Signed)
Pt's boyfriend called, stating that pt has been throwing up non-stop since 4am.   Would like to know if you can write a Rx? Per boyfriend, they are currently at [facility] at [facility], however, the UC is supper busy and wait time is long.   Please advice  [phone]

## 2022-02-10 NOTE — Telephone Encounter (Signed)
Called LVM for re meds being sent to pharmacy for nausea. Told pt to call if has any questions.

## 2022-04-01 ENCOUNTER — Ambulatory Visit (HOSPITAL_COMMUNITY)
Admission: EM | Admit: 2022-04-01 | Discharge: 2022-04-01 | Disposition: A | Discharge status: Home / Self Care | Payer: 59 | Attending: Emergency Medicine | Admitting: Emergency Medicine

## 2022-04-01 ENCOUNTER — Encounter (HOSPITAL_COMMUNITY): Payer: Self-pay

## 2022-04-01 ENCOUNTER — Ambulatory Visit (INDEPENDENT_AMBULATORY_CARE_PROVIDER_SITE_OTHER): Payer: 59

## 2022-04-01 DIAGNOSIS — I10 Essential (primary) hypertension: Secondary | ICD-10-CM | POA: Diagnosis not present

## 2022-04-01 DIAGNOSIS — R059 Cough, unspecified: Secondary | ICD-10-CM | POA: Diagnosis not present

## 2022-04-01 DIAGNOSIS — B9689 Other specified bacterial agents as the cause of diseases classified elsewhere: Secondary | ICD-10-CM

## 2022-04-01 DIAGNOSIS — J069 Acute upper respiratory infection, unspecified: Secondary | ICD-10-CM | POA: Diagnosis not present

## 2022-04-01 DIAGNOSIS — R0602 Shortness of breath: Secondary | ICD-10-CM

## 2022-04-01 DIAGNOSIS — R051 Acute cough: Secondary | ICD-10-CM

## 2022-04-01 MED ORDER — BENZONATATE 100 MG PO CAPS: 100.0000 mg | ORAL_CAPSULE | Freq: Three times a day (TID) | ORAL | 0 refills | Status: DC

## 2022-04-01 MED ORDER — AZITHROMYCIN 250 MG PO TABS: 250.0000 mg | ORAL_TABLET | Freq: Every day | ORAL | 0 refills | Status: DC

## 2022-04-01 MED ORDER — PREDNISONE 10 MG (21) PO TBPK: ORAL_TABLET | Freq: Every day | ORAL | 0 refills | Status: DC

## 2022-04-01 MED ORDER — ALBUTEROL SULFATE HFA 108 (90 BASE) MCG/ACT IN AERS: 1.0000 | INHALATION_SPRAY | Freq: Four times a day (QID) | RESPIRATORY_TRACT | 0 refills | Status: AC | PRN

## 2022-04-01 NOTE — Discharge Instructions (Addendum)
Your symptoms appear consistent with a bacterial upper respiratory infection.  Your chest x-ray was negative for pneumonia.  Please start azithromycin and take all antibiotics until finished.  You can use Tessalon Perles 100 mg as needed for cough.  You can use the albuterol inhaler 1 to 2 puffs every 6 hours as needed for wheezing or shortness of breath.  You should see some improvement over the next 48 to 72 hours.  If your symptoms continue beyond your antibiotics, please follow-up with your primary care provider.  If you develop worsening of shortness of breath, cough, chest pain or fever that does not resolve with medication please return to clinic or seek immediate care.

## 2022-04-01 NOTE — ED Triage Notes (Signed)
Patient reports that she began having an intermittent cough and SOB x 1 month. Patient states for the past 2-3 days she has had intermittent mid chest pain that radiates into the back. Patient states that she has had a small amount of clear sputum when she coughs.  Patient states she has been taking Mucinex ,Vicks vapor rub, and Benadryl 2 days ago.

## 2022-04-01 NOTE — ED Provider Notes (Signed)
[place]    CSN: [id number] Arrival date & time: 04/01/22  1151      History   Chief Complaint Chief Complaint  Patient presents with   Cough   Chest Pain    HPI Faith Bowers is a 22 y.o. female.   Patient reports central chest pain for the past 4 days, mid-sternal pain that moves up to her throat after coughing spell and then SOB. Her cough and SOB have been ongoing for the past month, initially started with fatigue, chills, emesis and diarrhea about a month prior, had some resolution but now her symptoms have returned and worsened. Feels like her chest vibrates with her coughing. Deep breaths hurt. Vicks vapor rub on chest and back, felt a lot of relief with this yesterday. She had mucus this morning, reports getting a ball of this up this morning. Denies fevers. Reports ongoing cough and mucus. Is on amlodipine, has not yet taken her BP meds today. Denies recents falls or trauma.   The history is provided by the patient.  Cough Cough characteristics:  Productive Associated symptoms: chest pain, shortness of breath and sore throat   Associated symptoms: no chills, no diaphoresis and no wheezing   Chest Pain Associated symptoms: cough, fatigue and shortness of breath   Associated symptoms: no abdominal pain and no diaphoresis     Past Medical History:  Diagnosis Date   Acne    Hypertension     Patient Active Problem List   Diagnosis Date Noted   Hypertension 12/26/2021   Encounter for gynecological examination with Papanicolaou smear of cervix 12/26/2021   Nexplanon in place 12/26/2021   Nexplanon insertion 02/25/2021   Obesity (BMI 30-39.9) 10/04/2017   Acne vulgaris 11/30/2014    Past Surgical History:  Procedure Laterality Date   TONSILLECTOMY AND ADENOIDECTOMY  2007   WISDOM TOOTH EXTRACTION      OB History     Gravida  0   Para  0   Term  0   Preterm  0   AB  0   Living  0      SAB  0   IAB  0   Ectopic  0   Multiple   0   Live Births  0            Home Medications    Prior to Admission medications   Medication Sig Start Date End Date Taking? Authorizing Provider  albuterol (VENTOLIN HFA) 108 (90 Base) MCG/ACT inhaler Inhale 1-2 puffs into the lungs every 6 (six) hours as needed for wheezing or shortness of breath. 04/01/22  Yes Louretta Shorten, [place], FNP  amLODipine (NORVASC) 5 MG tablet Take 1 tablet (5 mg total) by mouth daily. 04/17/21   Susy Frizzle, MD  azithromycin (ZITHROMAX) 250 MG tablet Take 1 tablet (250 mg total) by mouth daily. Take first 2 tablets together, then 1 every day until finished. 04/01/22  Yes Louretta Shorten, [place], FNP  benzonatate (TESSALON) 100 MG capsule Take 1 capsule (100 mg total) by mouth every 8 (eight) hours. 04/01/22  Yes Louretta Shorten, [place], FNP  Etonogestrel [employer]) Inject into the skin.    [provider]  guaiFENesin (MUCINEX) 600 MG 12 hr tablet Take by mouth 2 (two) times daily as needed.    [provider]  ibuprofen (ADVIL) 800 MG tablet Take 800 mg by mouth every 8 (eight) hours as needed. 04/15/21   [provider]  predniSONE (STERAPRED UNI-PAK 21  TAB) 10 MG (21) TBPK tablet Take by mouth daily. Take 6 tabs by mouth daily  for 2 days, then 5 tabs for 2 days, then 4 tabs for 2 days, then 3 tabs for 2 days, 2 tabs for 2 days, then 1 tab by mouth daily for 2 days 04/01/22  Yes Louretta Shorten, [place], FNP    Family History Family History  Problem Relation Age of Onset   Hypertension Paternal Grandmother    Diabetes Paternal Grandmother    Hypertension Maternal Grandmother    Arthritis Maternal Grandmother    Diabetes Maternal Grandmother    Hypertension Maternal Grandfather    Arthritis Maternal Grandfather    COPD Maternal Grandfather    Diabetes Maternal Grandfather    Multiple sclerosis Father    Anxiety disorder Mother    Asthma Mother    Diabetes Mother    Hypertension Mother    Hyperlipidemia Mother    Asthma  Brother    Diabetes Brother    Hypertension Brother     Social History Social History   Tobacco Use   Smoking status: Former    Packs/day: 1.00    Years: 3.00    Total pack years: 3.00    Types: Cigarettes   Smokeless tobacco: Never  Vaping Use   Vaping Use: Some days   Substances: Nicotine, Flavoring  Substance Use Topics   Alcohol use: Yes    Comment: occ   Drug use: Never     Allergies   Patient has no known allergies.   Review of Systems Review of Systems  Constitutional:  Positive for fatigue. Negative for chills and diaphoresis.  HENT:  Positive for congestion and sore throat.   Respiratory:  Positive for cough and shortness of breath. Negative for wheezing.   Cardiovascular:  Positive for chest pain.  Gastrointestinal:  Negative for abdominal pain.  Musculoskeletal:  Negative for arthralgias.  Neurological:  Negative for light-headedness.     Physical Exam Triage Vital Signs ED Triage Vitals  Enc Vitals Group     BP 04/01/22 1200 (!) 152/104     Pulse Rate 04/01/22 1200 (!) 103     Resp 04/01/22 1200 18     Temp 04/01/22 1200 98.4 F (36.9 C)     Temp Source 04/01/22 1200 Oral     SpO2 04/01/22 1200 98 %     Weight --      Height --      Head Circumference --      Peak Flow --      Pain Score 04/01/22 1202 4     Pain Loc --      Pain Edu? --      Excl. in [place]? --    No data found.  Updated Vital Signs BP (!) 152/104 (BP Location: Left Arm)   Pulse (!) 103   Temp 98.4 F (36.9 C) (Oral)   Resp 18   SpO2 98%   Visual Acuity Right Eye Distance:   Left Eye Distance:   Bilateral Distance:    Right Eye Near:   Left Eye Near:    Bilateral Near:     Physical Exam Vitals and nursing note reviewed.  Constitutional:      General: She is not in acute distress.    Appearance: Normal appearance. She is well-developed. She is not ill-appearing.     Comments: Pleasant 22 year old female who appears stated age.  HENT:     Head: Normocephalic  and atraumatic.  Right Ear: Tympanic membrane, ear canal and external ear normal.     Left Ear: Tympanic membrane, ear canal and external ear normal.     Nose: Nose normal.     Mouth/Throat:     Mouth: Mucous membranes are moist.     Pharynx: Posterior oropharyngeal erythema present.  Eyes:     Pupils: Pupils are equal, round, and reactive to light.  Cardiovascular:     Rate and Rhythm: Normal rate and regular rhythm.     Heart sounds: Normal heart sounds, S1 normal and S2 normal.  Pulmonary:     Effort: Pulmonary effort is normal.     Breath sounds: Examination of the right-middle field reveals decreased breath sounds. Examination of the right-lower field reveals decreased breath sounds. Decreased breath sounds present. No wheezing, rhonchi or rales.     Comments: Decreased breath sounds in the mid and lower right lobe.  Without wheezing or rhonchi bilaterally. Chest:     Chest wall: Tenderness present. No deformity, swelling, crepitus or edema.       Comments: Mild point tenderness to anterior upper sternum and neck area.  Musculoskeletal:        General: No swelling.     Cervical back: Normal range of motion.  Skin:    General: Skin is warm and dry.  Neurological:     General: No focal deficit present.     Mental Status: She is alert.  Psychiatric:        Mood and Affect: Mood normal.        Behavior: Behavior is cooperative.      UC Treatments / Results  Labs (all labs ordered are listed, but only abnormal results are displayed) Labs Reviewed - No data to display  EKG Orders placed or performed during the hospital encounter of 04/01/22   EKG 12-Lead   EKG 12-Lead       Radiology DG Chest 2 View  Result Date: 04/01/2022 CLINICAL DATA:  SOB, cough EXAM: CHEST - 2 VIEW COMPARISON:  None available FINDINGS: Cardiomediastinal silhouette and pulmonary vasculature are within normal limits. Lungs are clear. IMPRESSION: No acute cardiopulmonary process.  Electronically Signed   By: Miachel Roux M.D.   On: 04/01/2022 13:23    Procedures Procedures (including critical care time)  Medications Ordered in UC Medications - No data to display  Initial Impression / Assessment and Plan / UC Course  I have reviewed the triage vital signs and the nursing notes.  Pertinent labs & imaging results that were available during my care of the patient were reviewed by me and considered in my medical decision making (see chart for details).  Vital signs and nursing notes reviewed.  Patient has not yet taken amlodipine for hypertension today.  EKG reviewed by me as showing normal sinus rhythms without ST abnormality, no STEMI, ventricular rate of 88 bpm.  Chest x-ray showed no acute cardiopulmonary disease.  Due to ongoing productive cough, will treat for bacterial upper respiratory infection.  Provided with albuterol inhaler as needed for shortness of breath, Tessalon Perles as needed for cough.  Return precautions discussed, patient verbalized understanding of treatment plan.     Final Clinical Impressions(s) / UC Diagnoses   Final diagnoses:  Acute cough  Essential hypertension  Bacterial upper respiratory infection     Discharge Instructions      Your symptoms appear consistent with a bacterial upper respiratory infection.  Your chest x-ray was negative for pneumonia.  Please start azithromycin and take  all antibiotics until finished.  You can use Tessalon Perles 100 mg as needed for cough.  You can use the albuterol inhaler 1 to 2 puffs every 6 hours as needed for wheezing or shortness of breath.  You should see some improvement over the next 48 to 72 hours.  If your symptoms continue beyond your antibiotics, please follow-up with your primary care provider.  If you develop worsening of shortness of breath, cough, chest pain or fever that does not resolve with medication please return to clinic or seek immediate care.     ED Prescriptions      Medication Sig Dispense Auth. Provider   benzonatate (TESSALON) 100 MG capsule Take 1 capsule (100 mg total) by mouth every 8 (eight) hours. 21 capsule Louretta Shorten, [place], [place]   azithromycin (ZITHROMAX) 250 MG tablet Take 1 tablet (250 mg total) by mouth daily. Take first 2 tablets together, then 1 every day until finished. 6 tablet Louretta Shorten, [place], FNP   albuterol (VENTOLIN HFA) 108 (90 Base) MCG/ACT inhaler Inhale 1-2 puffs into the lungs every 6 (six) hours as needed for wheezing or shortness of breath. 18 g Louretta Shorten, [place], [place]   predniSONE (STERAPRED UNI-PAK 21 TAB) 10 MG (21) TBPK tablet Take by mouth daily. Take 6 tabs by mouth daily  for 2 days, then 5 tabs for 2 days, then 4 tabs for 2 days, then 3 tabs for 2 days, 2 tabs for 2 days, then 1 tab by mouth daily for 2 days 42 tablet Louretta Shorten, [place], [place]      I have reviewed the PDMP during this encounter.   Emric Kowalewski, [place],  04/01/22 1400

## 2022-06-25 ENCOUNTER — Other Ambulatory Visit: Payer: Self-pay

## 2022-06-25 MED ORDER — AMLODIPINE BESYLATE 5 MG PO TABS: 5.0000 mg | ORAL_TABLET | Freq: Every day | ORAL | 0 refills | Status: DC

## 2022-06-25 NOTE — Telephone Encounter (Signed)
Pt's dad called in to request a courtesy refill of this med amLODipine (NORVASC) 5 MG tablet [[id number]]. Pt is almost out of this med.  LOV: 04/17/21 UPCOMING CPE SCHEDULED FOR 07/30/22  PHARMACY: [facility] [address] [place] - [address]  [address] [place] [address]    CB#: [phone]

## 2022-06-25 NOTE — Addendum Note (Signed)
Addended by: Arta Silence on: 06/25/2022 11:40 AM   Modules accepted: Orders

## 2022-06-25 NOTE — Addendum Note (Signed)
Addended by: Carlean Purl on: 06/25/2022 11:46 AM   Modules accepted: Orders

## 2022-06-25 NOTE — Telephone Encounter (Signed)
Duplicate request- already filled by provider Requested Prescriptions  Pending Prescriptions Disp Refills   amLODipine (NORVASC) 5 MG tablet 90 tablet 0    Sig: Take 1 tablet (5 mg total) by mouth daily.     Cardiovascular: Calcium Channel Blockers 2 Failed - 06/25/2022 11:46 AM      Failed - Last BP in normal range    BP Readings from Last 1 Encounters:  04/01/22 (!) 152/104         Failed - Valid encounter within last 6 months    Recent Outpatient Visits           1 year ago Elevated blood sugar   [facility] Donita Brooks, MD   2 years ago Obesity (BMI 30-39.9)   [facility] Pickard, Priscille Heidelberg, MD   3 years ago Obesity (BMI 30-39.9)   [facility] [place], Velna Hatchet, MD   4 years ago Family history of diabetes mellitus   [facility] [place], Velna Hatchet, MD   4 years ago Health check for child over 46 days old   [facility] [place], Velna Hatchet, MD       Future Appointments             In 1 month Pickard, Priscille Heidelberg, MD [facility]            Passed - Last Heart Rate in normal range    Pulse Readings from Last 1 Encounters:  04/01/22 (!) 103         Signed Prescriptions Disp Refills   amLODipine (NORVASC) 5 MG tablet 90 tablet 0    Sig: Take 1 tablet (5 mg total) by mouth daily.     There is no refill protocol information for this order

## 2022-07-30 ENCOUNTER — Encounter: Payer: Self-pay | Admitting: Family Medicine

## 2022-07-30 ENCOUNTER — Ambulatory Visit (INDEPENDENT_AMBULATORY_CARE_PROVIDER_SITE_OTHER): Payer: 59 | Admitting: Family Medicine

## 2022-07-30 VITALS — BP 116/72 | HR 78 | Temp 98.6°F | Ht 63.0 in | Wt 213.6 lb

## 2022-07-30 DIAGNOSIS — Z0000 Encounter for general adult medical examination without abnormal findings: Secondary | ICD-10-CM

## 2022-07-30 DIAGNOSIS — I159 Secondary hypertension, unspecified: Secondary | ICD-10-CM | POA: Diagnosis not present

## 2022-07-30 DIAGNOSIS — Z0001 Encounter for general adult medical examination with abnormal findings: Secondary | ICD-10-CM | POA: Diagnosis not present

## 2022-07-30 MED ORDER — AMLODIPINE BESYLATE 10 MG PO TABS: 10.0000 mg | ORAL_TABLET | Freq: Every day | ORAL | 3 refills | Status: AC

## 2022-07-30 NOTE — Progress Notes (Signed)
Subjective:    Patient ID: Faith Bowers, female    DOB: 10-28-2000, 22 y.o.   MRN: [id number]  HPI Patient is a 22 year old white female here today for complete physical exam.  She is currently on amlodipine 5 mg a day for hypertension.  However her blood pressure remains poorly controlled.  Systolic blood pressures are between 150 and 160.  She reports diastolic blood pressures between 90 and 100.  She is currently on Nexplanon for birth control.  She is engaged and she is sexually active.  This led to the choice of the amlodipine due to potential teratogenic effects of other blood pressure medication.  However her blood pressure raises concern about secondary hypertension.  She does have obesity with a BMI close to 38.  She also reports symptoms consistent with sleep apnea.  She is always tired.  She can easily fall asleep while riding in a car, after eating lunch, watching TV, reading a book.  She always feels tired and sleepy.  Her fianc is for her to gasp for air at night and woke her up because he was concerned.  Therefore sleep apnea is certainly a potential.  Past Medical History:  Diagnosis Date   Acne    Hypertension    Past Surgical History:  Procedure Laterality Date   TONSILLECTOMY AND ADENOIDECTOMY  2007   WISDOM TOOTH EXTRACTION     Current Outpatient Medications on File Prior to Visit  Medication Sig Dispense Refill   albuterol (VENTOLIN HFA) 108 (90 Base) MCG/ACT inhaler Inhale 1-2 puffs into the lungs every 6 (six) hours as needed for wheezing or shortness of breath. 18 g 0   amLODipine (NORVASC) 5 MG tablet Take 1 tablet (5 mg total) by mouth daily. 90 tablet 0   azithromycin (ZITHROMAX) 250 MG tablet Take 1 tablet (250 mg total) by mouth daily. Take first 2 tablets together, then 1 every day until finished. 6 tablet 0   benzonatate (TESSALON) 100 MG capsule Take 1 capsule (100 mg total) by mouth every 8 (eight) hours. 21 capsule 0   Etonogestrel (NEXPLANON [place])  Inject into the skin.     guaiFENesin (MUCINEX) 600 MG 12 hr tablet Take by mouth 2 (two) times daily as needed.     ibuprofen (ADVIL) 800 MG tablet Take 800 mg by mouth every 8 (eight) hours as needed.     predniSONE (STERAPRED UNI-PAK 21 TAB) 10 MG (21) TBPK tablet Take by mouth daily. Take 6 tabs by mouth daily  for 2 days, then 5 tabs for 2 days, then 4 tabs for 2 days, then 3 tabs for 2 days, 2 tabs for 2 days, then 1 tab by mouth daily for 2 days 42 tablet 0   No current facility-administered medications on file prior to visit.   No Known Allergies Social History   Socioeconomic History   Marital status: Single    Spouse name: Not on file   Number of children: Not on file   Years of education: Not on file   Highest education level: Not on file  Occupational History   Not on file  Tobacco Use   Smoking status: Former    Packs/day: 1.00    Years: 3.00    Additional pack years: 0.00    Total pack years: 3.00    Types: Cigarettes   Smokeless tobacco: Never  Vaping Use   Vaping Use: Some days   Substances: Nicotine, Flavoring  Substance and Sexual Activity   Alcohol  use: Yes    Comment: occ   Drug use: Never   Sexual activity: Yes    Birth control/protection: Implant  Other Topics Concern   Not on file  Social History Narrative   Not on file   Social Determinants of Health   Financial Resource Strain: Medium Risk (12/26/2021)   Overall Financial Resource Strain (CARDIA)    Difficulty of Paying Living Expenses: Somewhat hard  Food Insecurity: Patient Declined (12/26/2021)   Hunger Vital Sign    Worried About Running Out of Food in the Last Year: Patient declined    Ran Out of Food in the Last Year: Patient declined  Transportation Needs: No Transportation Needs (12/26/2021)   PRAPARE - Administrator, Civil Service (Medical): No    Lack of Transportation (Non-Medical): No  Physical Activity: Insufficiently Active (12/26/2021)   Exercise Vital Sign     Days of Exercise per Week: 2 days    Minutes of Exercise per Session: 20 min  Stress: No Stress Concern Present (12/26/2021)   [employer] of Occupational Health - Occupational Stress Questionnaire    Feeling of Stress : Only a little  Social Connections: Moderately Isolated (12/26/2021)   Social Connection and Isolation Panel [NHANES]    Frequency of Communication with Friends and Family: More than three times a week    Frequency of Social Gatherings with Friends and Family: Three times a week    Attends Religious Services: 1 to 4 times per year    Active Member of Clubs or Organizations: No    Attends Banker Meetings: Never    Marital Status: Never married  Intimate Partner Violence: Not At Risk (12/26/2021)   Humiliation, Afraid, Rape, and Kick questionnaire    Fear of Current or Ex-Partner: No    Emotionally Abused: No    Physically Abused: No    Sexually Abused: No   Family History  Problem Relation Age of Onset   Hypertension Paternal Grandmother    Diabetes Paternal Grandmother    Hypertension Maternal Grandmother    Arthritis Maternal Grandmother    Diabetes Maternal Grandmother    Hypertension Maternal Grandfather    Arthritis Maternal Grandfather    COPD Maternal Grandfather    Diabetes Maternal Grandfather    Multiple sclerosis Father    Anxiety disorder Mother    Asthma Mother    Diabetes Mother    Hypertension Mother    Hyperlipidemia Mother    Asthma Brother    Diabetes Brother    Hypertension Brother      Review of Systems  All other systems reviewed and are negative.      Objective:   Physical Exam Vitals reviewed.  Constitutional:      General: She is not in acute distress.    Appearance: Normal appearance. She is obese. She is not ill-appearing, toxic-appearing or diaphoretic.  HENT:     Head: Normocephalic and atraumatic.     Right Ear: Tympanic membrane and ear canal normal. There is no impacted cerumen.     Left Ear:  Tympanic membrane and ear canal normal. There is no impacted cerumen.     Nose: Nose normal. No congestion or rhinorrhea.     Mouth/Throat:     Mouth: Mucous membranes are moist.     Pharynx: No oropharyngeal exudate or posterior oropharyngeal erythema.  Eyes:     General:        Right eye: No discharge.  Left eye: No discharge.     Extraocular Movements: Extraocular movements intact.     Conjunctiva/sclera: Conjunctivae normal.     Pupils: Pupils are equal, round, and reactive to light.  Neck:     Vascular: No carotid bruit.  Cardiovascular:     Rate and Rhythm: Normal rate and regular rhythm.     Pulses: Normal pulses.     Heart sounds: Normal heart sounds. No murmur heard.    No friction rub. No gallop.  Pulmonary:     Effort: Pulmonary effort is normal. No respiratory distress.     Breath sounds: Normal breath sounds. No stridor. No wheezing, rhonchi or rales.  Chest:     Chest wall: No tenderness.  Abdominal:     General: Abdomen is flat. Bowel sounds are normal. There is no distension.     Palpations: Abdomen is soft. There is no mass.     Tenderness: There is no abdominal tenderness. There is no guarding or rebound.     Hernia: No hernia is present.  Musculoskeletal:     Cervical back: Normal range of motion and neck supple. No rigidity or tenderness.     Right lower leg: No edema.     Left lower leg: No edema.  Lymphadenopathy:     Cervical: No cervical adenopathy.  Skin:    General: Skin is warm.     Coloration: Skin is not jaundiced or pale.     Findings: No bruising, erythema, lesion or rash.  Neurological:     General: No focal deficit present.     Mental Status: She is alert and oriented to person, place, and time. Mental status is at baseline.     Cranial Nerves: No cranial nerve deficit.     Sensory: No sensory deficit.     Motor: No weakness.     Coordination: Coordination normal.     Gait: Gait normal.     Deep Tendon Reflexes: Reflexes normal.   Psychiatric:        Mood and Affect: Mood normal.        Behavior: Behavior normal.        Thought Content: Thought content normal.        Judgment: Judgment normal.           Assessment & Plan:  Secondary hypertension - Plan: Aldosterone + renin activity w/ ratio, Cortisol, CBC with Differential/Platelet, Lipid panel, COMPLETE METABOLIC PANEL WITH GFR, US Renal Artery Stenosis, US Renal  General medical exam Regarding her physical exam, her gynecologist just performed a Pap smear.  However I am very concerned about her blood pressure and possible secondary hypertension given the severity of her blood pressure and her young age.  Increase amlodipine to 10 mg a day and recheck blood pressure in 2 weeks.  Evaluate for hyperaldosteronism by checking an aldosterone and renin activity with ratio.  Check a cortisol level to screen for Cushing's disease given her obesity and round face.  Check a CBC a CMP a lipid panel.  Check a renal artery ultrasound to evaluate for renal artery stenosis or fibromuscular dysplasia of the renal arteries.  Check a renal ultrasound to evaluate for medical renal disease.

## 2022-08-07 ENCOUNTER — Ambulatory Visit (HOSPITAL_COMMUNITY)
Admission: RE | Admit: 2022-08-07 | Discharge: 2022-08-07 | Disposition: A | Discharge status: Home / Self Care | Payer: 59 | Source: Ambulatory Visit | Attending: Family Medicine | Admitting: Family Medicine

## 2022-08-07 DIAGNOSIS — I159 Secondary hypertension, unspecified: Secondary | ICD-10-CM | POA: Diagnosis not present

## 2022-08-10 LAB — CBC WITH DIFFERENTIAL/PLATELET
Absolute Monocytes: 445 cells/uL (ref 200–950)
Basophils Absolute: 31 cells/uL (ref 0–200)
Basophils Relative: 0.4 %
Eosinophils Absolute: 47 cells/uL (ref 15–500)
Eosinophils Relative: 0.6 %
HCT: 42.8 % (ref 35.0–45.0)
Hemoglobin: 14.7 g/dL (ref 11.7–15.5)
Lymphs Abs: 2738 cells/uL (ref 850–3900)
MCH: 31 pg (ref 27.0–33.0)
MCHC: 34.3 g/dL (ref 32.0–36.0)
MCV: 90.3 fL (ref 80.0–100.0)
MPV: 11.3 fL (ref 7.5–12.5)
Monocytes Relative: 5.7 %
Neutro Abs: 4540 cells/uL (ref 1500–7800)
Neutrophils Relative %: 58.2 %
Platelets: 296 10*3/uL (ref 140–400)
RBC: 4.74 10*6/uL (ref 3.80–5.10)
RDW: 11.4 % (ref 11.0–15.0)
Total Lymphocyte: 35.1 %
WBC: 7.8 10*3/uL (ref 3.8–10.8)

## 2022-08-10 LAB — LIPID PANEL
Cholesterol: 138 mg/dL (ref <200)
HDL: 39 mg/dL — ABNORMAL LOW (ref >=50)
LDL Cholesterol (Calc): 77 mg/dL (calc)
Non-HDL Cholesterol (Calc): 99 mg/dL (calc) (ref <130)
Total CHOL/HDL Ratio: 3.5 (calc) (ref <5.0)
Triglycerides: 140 mg/dL (ref <150)

## 2022-08-10 LAB — COMPLETE METABOLIC PANEL WITH GFR
AG Ratio: 1.9 (calc) (ref 1.0–2.5)
ALT: 16 U/L (ref 6–29)
AST: 14 U/L (ref 10–30)
Albumin: 4.7 g/dL (ref 3.6–5.1)
Alkaline phosphatase (APISO): 56 U/L (ref 31–125)
BUN: 14 mg/dL (ref 7–25)
CO2: 25 mmol/L (ref 20–32)
Calcium: 9.8 mg/dL (ref 8.6–10.2)
Chloride: 104 mmol/L (ref 98–110)
Creat: 0.74 mg/dL (ref 0.50–0.96)
Globulin: 2.5 g/dL (calc) (ref 1.9–3.7)
Glucose, Bld: 117 mg/dL — ABNORMAL HIGH (ref 65–99)
Potassium: 3.8 mmol/L (ref 3.5–5.3)
Sodium: 139 mmol/L (ref 135–146)
Total Bilirubin: 0.4 mg/dL (ref 0.2–1.2)
Total Protein: 7.2 g/dL (ref 6.1–8.1)
eGFR: 118 mL/min/{1.73_m2} (ref >=60)

## 2022-08-10 LAB — ALDOSTERONE + RENIN ACTIVITY W/ RATIO
ALDO / PRA Ratio: 3.1 Ratio (ref 0.9–28.9)
Aldosterone: 4 ng/dL
Renin Activity: 1.28 ng/mL/h (ref 0.25–5.82)

## 2022-08-10 LAB — CORTISOL: Cortisol, Plasma: 12.2 ug/dL

## 2022-08-12 ENCOUNTER — Other Ambulatory Visit: Payer: Self-pay | Admitting: Family Medicine

## 2022-08-12 DIAGNOSIS — Q6102 Congenital multiple renal cysts: Secondary | ICD-10-CM

## 2022-08-12 DIAGNOSIS — I159 Secondary hypertension, unspecified: Secondary | ICD-10-CM

## 2022-08-12 DIAGNOSIS — I10 Essential (primary) hypertension: Secondary | ICD-10-CM

## 2022-08-12 NOTE — Progress Notes (Signed)
Error. Please disregard. Mjp,lpn

## 2023-02-14 ENCOUNTER — Encounter (HOSPITAL_COMMUNITY): Payer: Self-pay

## 2023-02-14 ENCOUNTER — Ambulatory Visit (HOSPITAL_COMMUNITY)
Admission: EM | Admit: 2023-02-14 | Discharge: 2023-02-14 | Disposition: A | Discharge status: Home / Self Care | Payer: 59 | Attending: Family Medicine | Admitting: Family Medicine

## 2023-02-14 DIAGNOSIS — I10 Essential (primary) hypertension: Secondary | ICD-10-CM | POA: Diagnosis not present

## 2023-02-14 DIAGNOSIS — H9201 Otalgia, right ear: Secondary | ICD-10-CM

## 2023-02-14 DIAGNOSIS — J069 Acute upper respiratory infection, unspecified: Secondary | ICD-10-CM

## 2023-02-14 MED ORDER — IBUPROFEN 400 MG PO TABS: 400.0000 mg | ORAL_TABLET | Freq: Three times a day (TID) | ORAL | 0 refills | Status: AC | PRN

## 2023-02-14 MED ORDER — PROMETHAZINE-DM 6.25-15 MG/5ML PO SYRP: 5.0000 mL | ORAL_SOLUTION | Freq: Three times a day (TID) | ORAL | 0 refills | Status: AC | PRN

## 2023-02-14 NOTE — ED Triage Notes (Signed)
Pt presents to the office for cough.nasal congestion and fatigue x 3 days.

## 2023-02-14 NOTE — ED Provider Notes (Signed)
[facility]    CSN: [id number] Arrival date & time: 02/14/23  1237      History   Chief Complaint Chief Complaint  Patient presents with   Cough   Nasal Congestion   Headache    HPI Faith Bowers is a 22 y.o. female.   The history is provided by the patient. No language interpreter was used.  Cough Cough characteristics:  Productive Sputum characteristics:  Clear (Previoulsy productive of yellowish sputum, but now mostly clear) Severity:  Moderate Onset quality:  Gradual Duration:  3 days Timing:  Intermittent Progression:  Improving Chronicity:  New Smoker: yes (Vape  1-2 weeks)   Context: sick contacts   Context comment:  Her step dad had flu and mom had bronchitis the next day Relieved by: Benadryl and tylenol as well as Sudafed. Worsened by:  Nothing Ineffective treatments: She has been hydrating herself well. Associated symptoms: chills, ear pain, headaches and sinus congestion   Associated symptoms: no fever, no myalgias, no shortness of breath, no sore throat and no wheezing   Associated symptoms comment:  Felt hot yesterday but did not measure her temp. Nose is stopped up. She feels congested in her nose and chest. She was diaphoretic 2 days ago as well as yesterday. Two days ago she had bad body aches, but none today. She now have mild pain in her right ear  HTN: Off meds for about a month. Had not been able to get to her pharmacy to pick up meds as she just moved.  Past Medical History:  Diagnosis Date   Acne    Hypertension     Patient Active Problem List   Diagnosis Date Noted   Hypertension 12/26/2021   Encounter for gynecological examination with Papanicolaou smear of cervix 12/26/2021   Nexplanon in place 12/26/2021   Nexplanon insertion 02/25/2021   Obesity (BMI 30-39.9) 10/04/2017   Acne vulgaris 11/30/2014    Past Surgical History:  Procedure Laterality Date   TONSILLECTOMY AND ADENOIDECTOMY  2007   WISDOM TOOTH  EXTRACTION      OB History     Gravida  0   Para  0   Term  0   Preterm  0   AB  0   Living  0      SAB  0   IAB  0   Ectopic  0   Multiple  0   Live Births  0            Home Medications    Prior to Admission medications   Medication Sig Start Date End Date Taking? Authorizing Provider  Etonogestrel (NEXPLANON [place]) Inject into the skin.   Yes [provider]  promethazine-dextromethorphan (PROMETHAZINE-DM) 6.25-15 MG/5ML syrup Take 5 mLs by mouth every 8 (eight) hours as needed for up to 8 days for cough. 02/14/23 02/22/23 Yes Doreene Eland, MD  albuterol (VENTOLIN HFA) 108 (90 Base) MCG/ACT inhaler Inhale 1-2 puffs into the lungs every 6 (six) hours as needed for wheezing or shortness of breath. 04/01/22   [place], [place] N, FNP  amLODipine (NORVASC) 10 MG tablet Take 1 tablet (10 mg total) by mouth daily. 07/30/22   Donita Brooks, MD  ibuprofen (ADVIL) 400 MG tablet Take 1 tablet (400 mg total) by mouth every 8 (eight) hours as needed for moderate pain (pain score 4-6). 02/14/23   Doreene Eland, MD    Family History Family History  Problem Relation Age of Onset  Hypertension Paternal Grandmother    Diabetes Paternal Grandmother    Hypertension Maternal Grandmother    Arthritis Maternal Grandmother    Diabetes Maternal Grandmother    Hypertension Maternal Grandfather    Arthritis Maternal Grandfather    COPD Maternal Grandfather    Diabetes Maternal Grandfather    Multiple sclerosis Father    Anxiety disorder Mother    Asthma Mother    Diabetes Mother    Hypertension Mother    Hyperlipidemia Mother    Asthma Brother    Diabetes Brother    Hypertension Brother     Social History Social History   Tobacco Use   Smoking status: Former    Current packs/day: 1.00    Average packs/day: 1 pack/day for 3.0 years (3.0 ttl pk-yrs)    Types: Cigarettes   Smokeless tobacco: Never  Vaping Use   Vaping status: Some Days    Substances: Nicotine, Flavoring  Substance Use Topics   Alcohol use: Yes    Comment: occ   Drug use: Never     Allergies   Patient has no known allergies.   Review of Systems Review of Systems  Constitutional:  Positive for chills. Negative for fever.  HENT:  Positive for ear pain. Negative for sore throat.   Respiratory:  Positive for cough. Negative for shortness of breath and wheezing.   Musculoskeletal:  Negative for myalgias.  Neurological:  Positive for headaches.     Physical Exam Triage Vital Signs ED Triage Vitals  Encounter Vitals Group     BP      Systolic BP Percentile      Diastolic BP Percentile      Pulse      Resp      Temp      Temp src      SpO2      Weight      Height      Head Circumference      Peak Flow      Pain Score      Pain Loc      Pain Education      Exclude from Growth Chart    No data found.  Updated Vital Signs BP (!) 171/119   Pulse (!) 103   Temp 98.9 F (37.2 C) (Oral)   Resp 16   LMP 01/01/2023   SpO2 98%   Visual Acuity Right Eye Distance:   Left Eye Distance:   Bilateral Distance:    Right Eye Near:   Left Eye Near:    Bilateral Near:     Physical Exam Constitutional:      General: She is not in acute distress.    Appearance: She is not ill-appearing or toxic-appearing.  HENT:     Right Ear: Tympanic membrane, ear canal and external ear normal. There is no impacted cerumen.     Left Ear: Tympanic membrane, ear canal and external ear normal. There is no impacted cerumen.     Mouth/Throat:     Mouth: Mucous membranes are moist.     Pharynx: Oropharynx is clear. No oropharyngeal exudate or posterior oropharyngeal erythema.  Eyes:     Extraocular Movements: Extraocular movements intact.     Conjunctiva/sclera: Conjunctivae normal.     Pupils: Pupils are equal, round, and reactive to light.  Cardiovascular:     Rate and Rhythm: Normal rate and regular rhythm.     Heart sounds: Normal heart sounds. No  murmur heard. Pulmonary:  Effort: Pulmonary effort is normal. No respiratory distress.     Breath sounds: Normal breath sounds. No wheezing or rhonchi.  Neurological:     Mental Status: She is alert.      UC Treatments / Results  Labs (all labs ordered are listed, but only abnormal results are displayed) Labs Reviewed - No data to display  EKG   Radiology No results found.  Procedures Procedures (including critical care time)  Medications Ordered in UC Medications - No data to display  Initial Impression / Assessment and Plan / UC Course  I have reviewed the triage vital signs and the nursing notes.  Pertinent labs & imaging results that were available during my care of the patient were reviewed by me and considered in my medical decision making (see chart for details).  Clinical Course as of 02/14/23 1411  Sun Feb 14, 2023  1409 Viral URI Likely influenza given recent exposure Since she is better we opted not to viral test her especially since has been ongoing for 3 days or more Promethazine DM prn cough as well as Ibuprofen escribed May rest at home  - work letter given [KE]  1410 Otalgia - part of viral illness Ibuprofen prn pain escribed [KE]  1410 HTN BP elevated due to medication non-adherence She will pick up her Norvasc today and f/u with PCP for BP management [KE]    Clinical Course User Index [KE] Doreene Eland, MD    LMP;She provided wrong info to the CMA about her LMP and wanted me to update this on file. LMP -  01/21/23 - She has irregular periods. She is on Nexplanon.   Final Clinical Impressions(s) / UC Diagnoses   Final diagnoses:  Acute upper respiratory infection     Discharge Instructions      It was nice seeing you. I am sorry about your respiratory symptoms, but glad you are feeling better. We decided not to proceed with flu testing since you feel better, and it has been more than 2 days since your symptoms started. Your exam is  otherwise reassuring. I sent in medication for pain and cough. Please start taking your BP meds soon and follow up with your PCP. Stay well otherwise.     ED Prescriptions     Medication Sig Dispense Auth. Provider   ibuprofen (ADVIL) 400 MG tablet Take 1 tablet (400 mg total) by mouth every 8 (eight) hours as needed for moderate pain (pain score 4-6). 30 tablet Janit Pagan T, MD   promethazine-dextromethorphan (PROMETHAZINE-DM) 6.25-15 MG/5ML syrup Take 5 mLs by mouth every 8 (eight) hours as needed for up to 8 days for cough. 118 mL Doreene Eland, MD      PDMP not reviewed this encounter.   Doreene Eland, MD 02/14/23 [phone]

## 2023-02-14 NOTE — Discharge Instructions (Signed)
It was nice seeing you. I am sorry about your respiratory symptoms, but glad you are feeling better. We decided not to proceed with flu testing since you feel better, and it has been more than 2 days since your symptoms started. Your exam is otherwise reassuring. I sent in medication for pain and cough. Please start taking your BP meds soon and follow up with your PCP. Stay well otherwise.

## 2023-11-10 ENCOUNTER — Other Ambulatory Visit: Payer: Self-pay | Admitting: Family Medicine

## 2024-03-02 ENCOUNTER — Encounter: Payer: Self-pay | Admitting: Adult Health

## 2024-03-02 ENCOUNTER — Ambulatory Visit: Admitting: Adult Health

## 2024-03-02 VITALS — BP 163/104 | HR 111 | Ht 63.0 in | Wt 220.5 lb

## 2024-03-02 DIAGNOSIS — Z3046 Encounter for surveillance of implantable subdermal contraceptive: Secondary | ICD-10-CM | POA: Diagnosis not present

## 2024-03-02 DIAGNOSIS — I10 Essential (primary) hypertension: Secondary | ICD-10-CM

## 2024-03-02 NOTE — Progress Notes (Signed)
" °  Subjective:     Patient ID: Faith Bowers, female   DOB: 2000/08/04, 24 y.o.   MRN: [id number]  HPI Faith Bowers is a 24 year old white female, with SO, G0P0, in for nexplanon  removal. She had missed some BP meds, but did take this morning on the ay here.     Component Value Date/Time   DIAGPAP  12/26/2021 0845    - Negative for intraepithelial lesion or malignancy (NILM)   ADEQPAP  12/26/2021 0845    Satisfactory for evaluation; transformation zone component PRESENT.    PCP is Dr Duanne Review of Systems For nexplanon  removal Reviewed past medical,surgical, social and family history. Reviewed medications and allergies.     Objective:   Physical Exam BP (!) 163/104 (BP Location: Right Arm, Patient Position: Sitting, Cuff Size: Large)   Pulse (!) 111   Ht 5' 3 (1.6 m)   Wt 220 lb 8 oz (100 kg)   LMP 02/28/2024 (Exact Date)   BMI 39.06 kg/m     NEXPLANON  REMOVAL Consent signed and time out called Left arm cleansed with betadine, and injected with 1.5 cc 2% lidocaine and waited til numb.Under sterile technique a #11 blade was used to make small vertical incision, and a curved forceps was used to easily remove rod. Steri strips applied. Pressure dressing applied.  Fall risk Is low  Upstream - 03/02/24 0959       Pregnancy Intention Screening   Does the patient want to become pregnant in the next year? Ok Either Way    Does the patient's partner want to become pregnant in the next year? Ok Either Way    Would the patient like to discuss contraceptive options today? No      Contraception Wrap Up   Current Method Female Condom;Hormonal Implant    End Method Female Condom    Contraception Counseling Provided Yes    How was the end contraceptive method provided? N/A          Assessment:     1. Encounter for Nexplanon  removal (Primary) Nexplanon  removed Use condoms, keep clean and dry x 24 hours, no heavy lifting, keep steri strips on x 72 hours, Keep pressure dressing on x  24 hours. Follow up prn problems.  Take OTC prenatal vitamin Note given for work, no heavy lifting for 24 hours   2. Hypertension, unspecified type Take BP meds and follow up with PCP    Plan:     Return in 10 months for pap and physical     "

## 2024-03-02 NOTE — Patient Instructions (Addendum)
 Use condoms, keep clean and dry x 24 hours, no heavy lifting, keep steri strips on x 72 hours, Keep pressure dressing on x 24 hours. Follow up prn problems. Return in 10 months for pap and physical and take Prenatal vitamin
